# Patient Record
Sex: Male | Born: 1940 | Marital: Single | Smoking: Former smoker
Health system: Southern US, Community
[De-identification: ages and names within clinical notes are randomized; demographics above are authoritative.]

## PROBLEM LIST (undated history)

## (undated) DIAGNOSIS — F419 Anxiety disorder, unspecified: Secondary | ICD-10-CM

## (undated) DIAGNOSIS — J45909 Unspecified asthma, uncomplicated: Secondary | ICD-10-CM

## (undated) DIAGNOSIS — F32A Depression, unspecified: Secondary | ICD-10-CM

## (undated) DIAGNOSIS — I10 Essential (primary) hypertension: Secondary | ICD-10-CM

---

## 2011-11-25 ENCOUNTER — Inpatient Hospital Stay: Payer: Self-pay | Admitting: Internal Medicine

## 2011-11-25 LAB — COMPREHENSIVE METABOLIC PANEL
Albumin: 4.5 g/dL (ref 3.4–5.0)
Alkaline Phosphatase: 62 U/L (ref 50–136)
Anion Gap: 10 (ref 7–16)
BUN: 33 mg/dL — ABNORMAL HIGH (ref 7–18)
Bilirubin,Total: 0.8 mg/dL (ref 0.2–1.0)
Calcium, Total: 9.8 mg/dL (ref 8.5–10.1)
Chloride: 92 mmol/L — ABNORMAL LOW (ref 98–107)
Co2: 26 mmol/L (ref 21–32)
Creatinine: 1.56 mg/dL — ABNORMAL HIGH (ref 0.60–1.30)
EGFR (Non-African Amer.): 44 — ABNORMAL LOW
Glucose: 144 mg/dL — ABNORMAL HIGH (ref 65–99)
Osmolality: 267 (ref 275–301)
Potassium: 4.2 mmol/L (ref 3.5–5.1)
SGOT(AST): 24 U/L (ref 15–37)
Sodium: 128 mmol/L — ABNORMAL LOW (ref 136–145)
Total Protein: 8.6 g/dL — ABNORMAL HIGH (ref 6.4–8.2)

## 2011-11-25 LAB — CBC
HGB: 13.7 g/dL (ref 13.0–18.0)
MCH: 29.3 pg (ref 26.0–34.0)
MCHC: 33.6 g/dL (ref 32.0–36.0)
RDW: 13.9 % (ref 11.5–14.5)
WBC: 16.4 10*3/uL — ABNORMAL HIGH (ref 3.8–10.6)

## 2011-11-25 LAB — PRO B NATRIURETIC PEPTIDE: B-Type Natriuretic Peptide: 247 pg/mL — ABNORMAL HIGH (ref 0–125)

## 2011-11-25 LAB — TROPONIN I
Troponin-I: <0.02 ng/mL
Troponin-I: <0.02 ng/mL

## 2011-11-26 LAB — CBC WITH DIFFERENTIAL/PLATELET
Basophil #: 0 10*3/uL (ref 0.0–0.1)
Eosinophil #: 0 10*3/uL (ref 0.0–0.7)
Eosinophil %: 0.3 %
HCT: 33.6 % — ABNORMAL LOW (ref 40.0–52.0)
HGB: 11.5 g/dL — ABNORMAL LOW (ref 13.0–18.0)
Lymphocyte #: 0.7 10*3/uL — ABNORMAL LOW (ref 1.0–3.6)
Lymphocyte %: 6 %
MCH: 29.5 pg (ref 26.0–34.0)
MCHC: 34.2 g/dL (ref 32.0–36.0)
Monocyte #: 0.9 x10 3/mm (ref 0.2–1.0)
Monocyte %: 7.6 %
Neutrophil %: 86 %
RDW: 13.7 % (ref 11.5–14.5)
WBC: 12.3 10*3/uL — ABNORMAL HIGH (ref 3.8–10.6)

## 2011-11-26 LAB — BASIC METABOLIC PANEL
Calcium, Total: 8.5 mg/dL (ref 8.5–10.1)
Chloride: 99 mmol/L (ref 98–107)
Co2: 23 mmol/L (ref 21–32)
Creatinine: 1.59 mg/dL — ABNORMAL HIGH (ref 0.60–1.30)
EGFR (African American): 50 — ABNORMAL LOW
Glucose: 138 mg/dL — ABNORMAL HIGH (ref 65–99)
Sodium: 131 mmol/L — ABNORMAL LOW (ref 136–145)

## 2011-11-27 LAB — BASIC METABOLIC PANEL
Anion Gap: 9 (ref 7–16)
Calcium, Total: 8.3 mg/dL — ABNORMAL LOW (ref 8.5–10.1)
Chloride: 100 mmol/L (ref 98–107)
Co2: 24 mmol/L (ref 21–32)
Creatinine: 1.28 mg/dL (ref 0.60–1.30)
Glucose: 92 mg/dL (ref 65–99)
Potassium: 4 mmol/L (ref 3.5–5.1)
Sodium: 133 mmol/L — ABNORMAL LOW (ref 136–145)

## 2011-11-28 LAB — CBC WITH DIFFERENTIAL/PLATELET
Eosinophil #: 0.4 10*3/uL (ref 0.0–0.7)
Eosinophil %: 4.6 %
HCT: 31.6 % — ABNORMAL LOW (ref 40.0–52.0)
MCH: 29.3 pg (ref 26.0–34.0)
MCHC: 34.1 g/dL (ref 32.0–36.0)
MCV: 86 fL (ref 80–100)
Monocyte #: 0.9 x10 3/mm (ref 0.2–1.0)
Neutrophil #: 5.6 10*3/uL (ref 1.4–6.5)
Neutrophil %: 67.5 %
Platelet: 154 10*3/uL (ref 150–440)
RDW: 13.5 % (ref 11.5–14.5)
WBC: 8.2 10*3/uL (ref 3.8–10.6)

## 2011-11-29 DIAGNOSIS — R0602 Shortness of breath: Secondary | ICD-10-CM

## 2011-12-01 LAB — CULTURE, BLOOD (SINGLE)

## 2013-06-30 IMAGING — CR DG CHEST 2V
1 series · 3 of 3 positions shown · non-contrast
Comparison: none

REASON FOR EXAM: shob
COMMENTS:

[Series 1: w chest pa · 0.14mm/px · 3 of 3 slices shown]
[im 1/3]
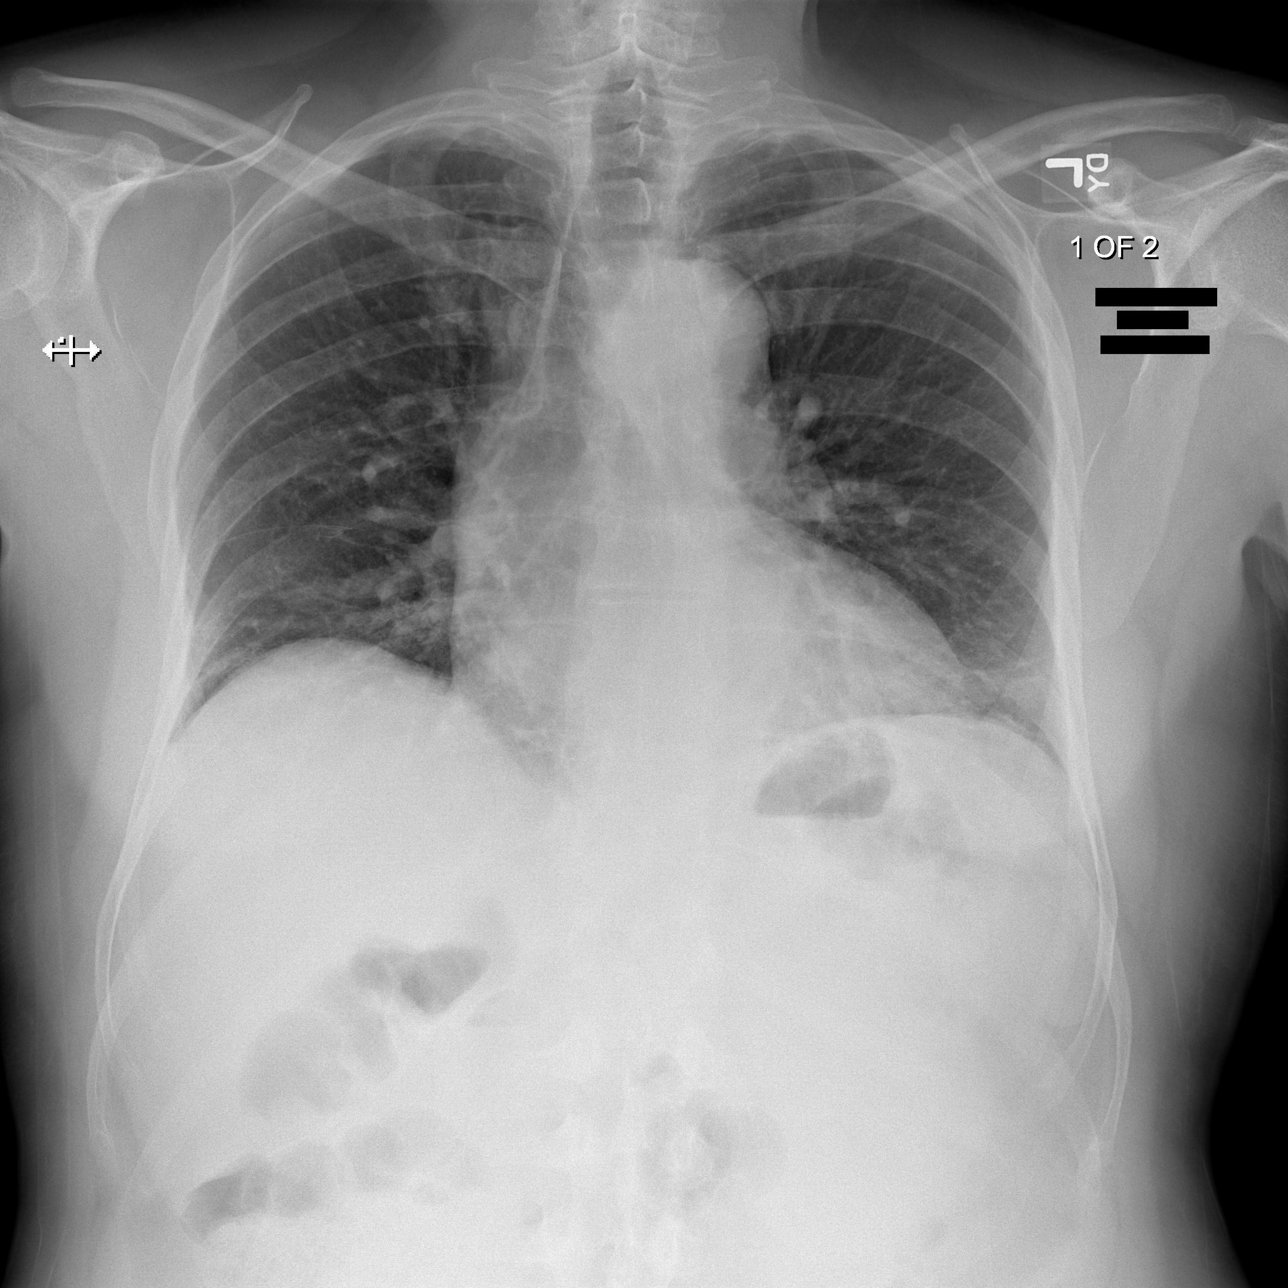
[im 2/3]
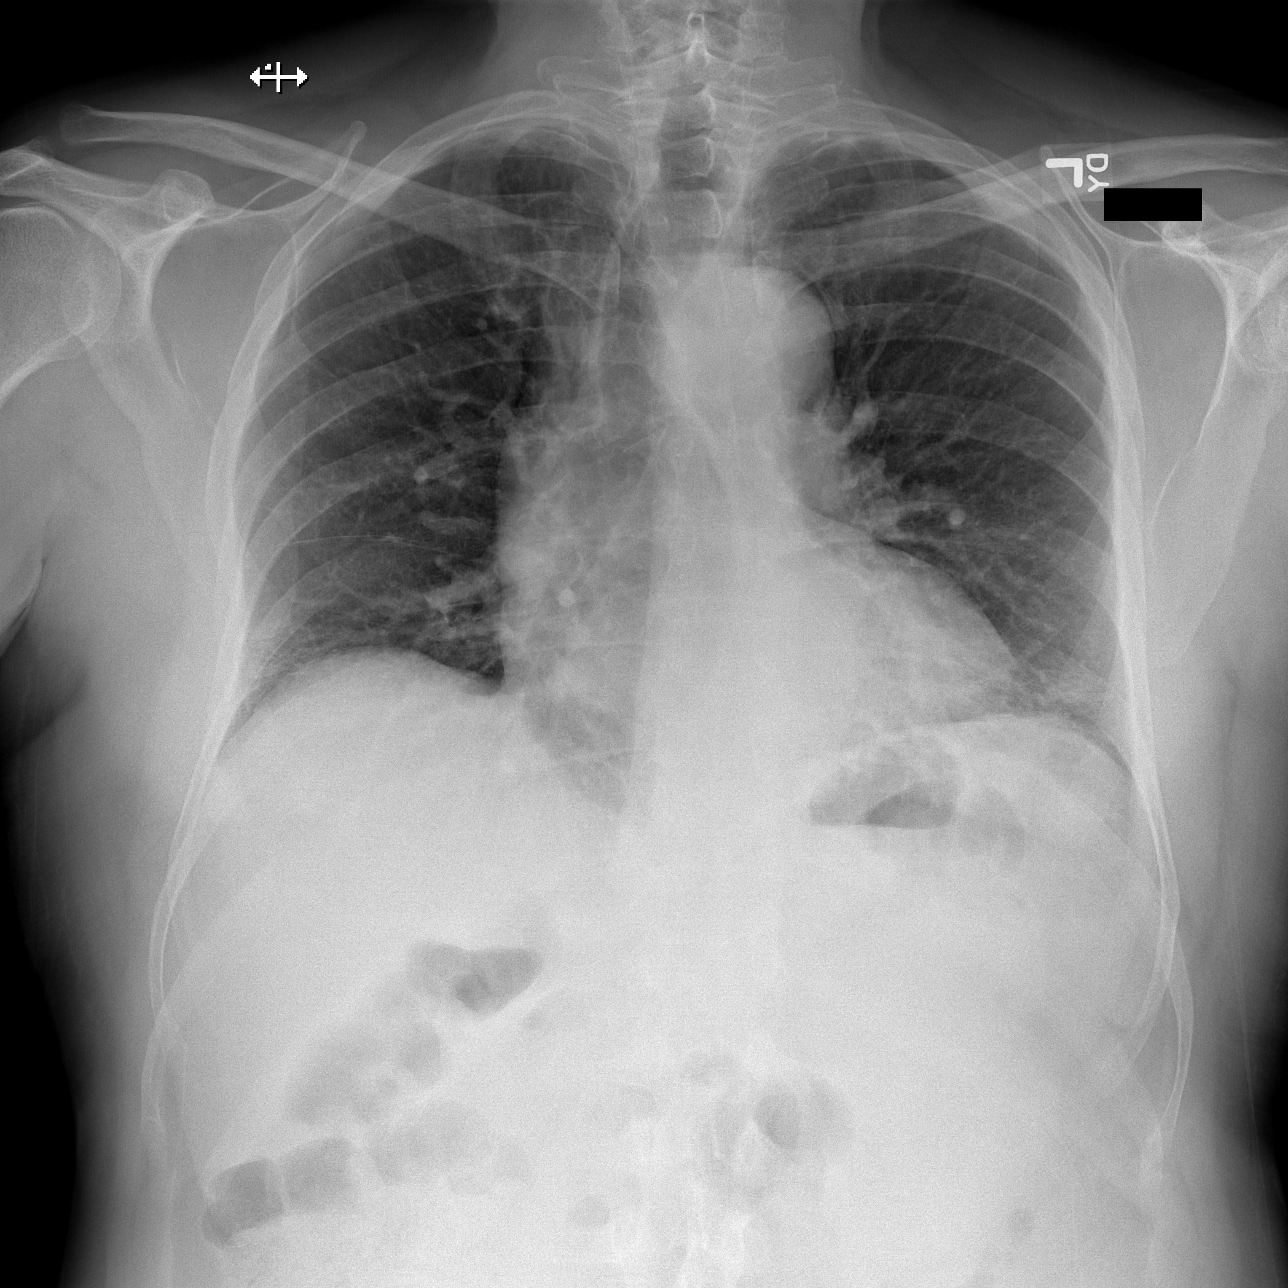
[im 3/3]
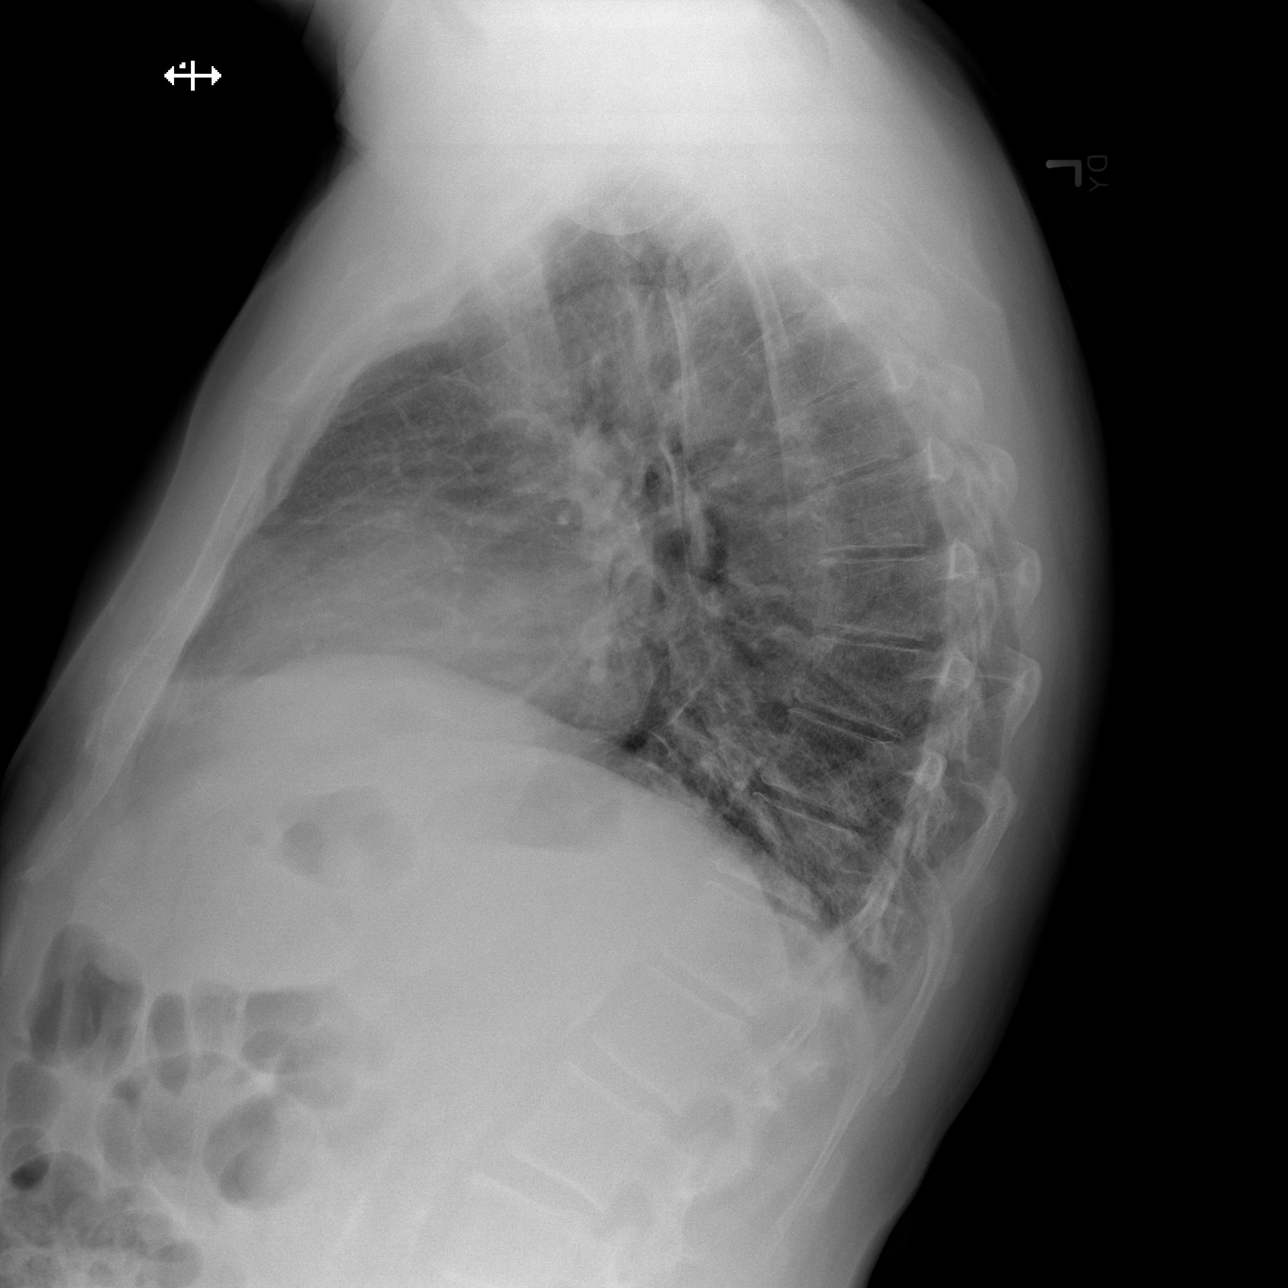

[3 of 3 positions shown; findings below may reference images not displayed]

PROCEDURE:     DXR - DXR CHEST PA (OR AP) AND LATERAL  - November 25, 2011 [DATE]

RESULT:     The lungs are hypoinflated. There are bibasilar atelectatic
changes. The cardiac silhouette is at the upper limits of normal for size.
The perihilar lung markings are prominent. On the lateral film coarse lung
markings project over the lower thoracic spine.
IMPRESSION: The findings suggest atelectasis or early infiltrate in the
lower lobes. There may be low-grade compensated CHF as well.

[REDACTED]

## 2013-07-02 IMAGING — CR DG CHEST 1V PORT
1 series · 1 of 1 positions shown · non-contrast
Comparison: none

REASON FOR EXAM: pneumonia
COMMENTS:

PROCEDURE:     DXR - DXR PORTABLE CHEST SINGLE VIEW  - November 27, 2011 [DATE]
RESULT:     Mild basilar atelectasis is present. The cardiovascular
structures are stable. Poor inspiratory effort.

[portable]
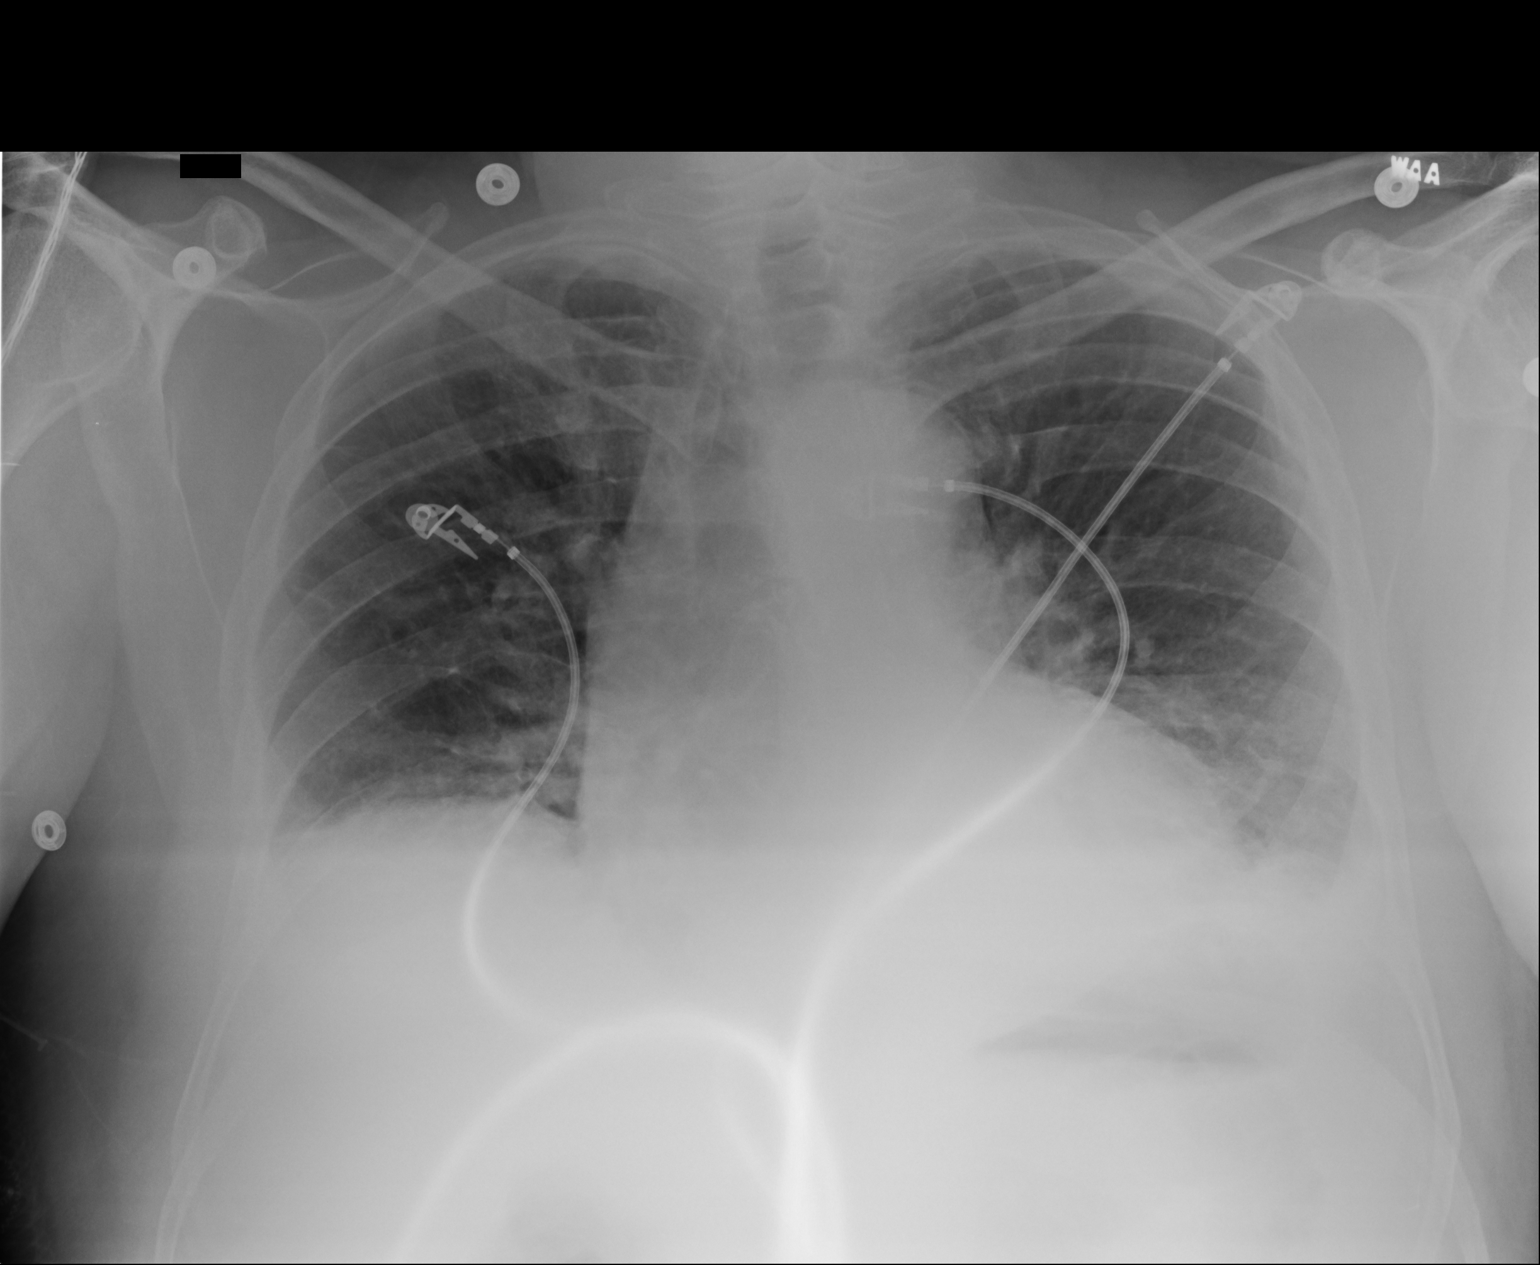

[1 of 1 positions shown; findings below may reference images not displayed]

IMPRESSION: Bibasilar atelectasis versus pneumonia. The findings are
new from 11/25/2011.

## 2014-11-14 NOTE — Discharge Summary (Signed)
PATIENT NAME:  Ledell PeoplesSHOFFNER, Troy L MR#:  [id number] DATE OF BIRTH:  1941/03/19  DATE OF ADMISSION:  11/25/2011 DATE OF DISCHARGE:  11/30/2011  DISCHARGE DIAGNOSES:  1. Hypoxic respiratory failure secondary to bilateral pneumonia.  2. Upper back pain due to musculoskeletal strain. 3. Resolved acute renal failure.  4. Hypertension. 5. History of panic attacks.   DISCHARGE MEDICATIONS:  1. Alprazolam 0.25 mg p.o. t.i.d. as needed. 2. Claritin 10 mg p.o. daily.  3. Fluoxetine 20 mg p.o. daily.  4. Aspirin 81 mg daily. 5. Benicar/HCTZ 20/12.5 mg p.o. half tablet daily.  6. Prednisone 40 mg daily for two days, 30 mg daily for two days, 20 mg daily for two days, 10 mg daily for two days.  7. Amoxicillin 500 mg p.o. t.i.d. for eight days. 8. Skelaxin 800 mg p.o. t.i.d. for back pain. 9. Norco 5/325 two tablets every six hours p.r.n.   DIET: Low sodium diet.   CONSULTATIONS: None.   FOLLOW-UP: The patient [place] a doctor in [place] and he can follow-up with that doctor.   ALLERGIES: Sulfa drugs.   HOSPITAL COURSE:  1. The patient is a 74 year old male with hypertension who came in because of back pain. The patient also had shortness of breath. The patient had an x-ray of the chest which showed atelectasis or early infiltrate in the lower lobes. He had elevated white count of 16,000 on admission. CT chest was done to rule out PE, PE negative but does have pneumonia bilaterally and COPD. The patient was placed on high flow nasal cannula and admitted to the hospitalist service on telemetry for pneumonia, started on Rocephin, Zithromax, and nebulizers. He was started on oxygen. The patient sats were 84% on 2 liters so he was started on high flow nasal cannula. Initially he was on Ventimask at 93% sats but he was unable to tolerate the mask so he was switched over to high flow nasal cannula and he was on it from May 6th to May 9th. Yesterday we titrated and weaned off high flow and changed to nasal  cannula. The patient has been afebrile and his oxygenation improved. This morning his sats were 98% on room air and also even with exertion they were 96%. The patient denies any chest pain, no trouble breathing, and no fevers. His white count also is improved to 8.2 on May 8th. On admission it showed 12.3 on May 6th. The patient's blood cultures have been negative. Troponins have been negative. CT of the chest showed bilateral pneumonia versus atelectasis.  2. Acute renal failure secondary to dehydration. His BUN and creatinine were elevated on admission and he is on IV fluids. His Benicar was held. Most recent BUN and creatinine on May 7th showed BUN 21 and creatinine 1.28. IV fluids were stopped. Ultrasound of the kidneys showed no acute changes. There was a small cyst in the left kidney and also small right kidney. The patient had normal urine output, did not have fever and no back pain. The patient had an echocardiogram done which showed EF more than 55% and normal systolic function with diastolic relaxation is affected. The same is explained to the patient.   CONDITION ON DISCHARGE: The patient is discharged home in stable condition.   TOTAL TIME SPENT ON DISCHARGE PREPARATION: More than 30 minutes.   ____________________________ Katha HammingSnehalatha Loy Mccartt, MD sk:drc D: 11/30/2011 23:17:42 ET T: 12/04/2011 09:10:55 ET JOB#: [id number]  cc: Katha HammingSnehalatha Ranada Vigorito, MD, <Dictator> Katha HammingSNEHALATHA Barnabas Henriques MD ELECTRONICALLY SIGNED 12/05/2011 23:24

## 2014-11-14 NOTE — H&P (Signed)
PATIENT NAME:  Troy Nash, Troy Nash MR#:  [id number] DATE OF BIRTH:  08-05-1940  DATE OF ADMISSION:  11/25/2011  PRIMARY CARE PHYSICIAN[place]-local[place]   CHIEF COMPLAINT: Back pain.   HISTORY OF PRESENT ILLNESS: This is a 74 year old male with history of hypertension, panic disorder, and allergies. Today he presented to the Emergency Room because he is complaining of upper back pain going on for a few days. He says he cannot take a deep breath. He feels a catch when he takes a deep breath. Initially he was using a heating pad on his back that was helping him. He said yesterday he did a lot of work and that exacerbated the back pain. He said has a mild cough also, is slightly short of breath but is not complaining of shortness of breath per se.  He said the pain is in between the shoulder blades.  He denies any chest pain. In the Emergency Room a chest x-ray showed that he had atelectasis versus early infiltrate in the lower lobes. His white count was elevated to 16,000 and a CT of the chest was done to rule out PE. He is negative for pulmonary embolism but there is bibasilar pneumonia and there is also seen some chronic obstructive pulmonary disease. He does not have a diagnosis of chronic obstructive pulmonary disease. His ABG here showed that his pO2 is 52 on room air.  He thinks that it is because of his increased work-out yesterday that he has the back pain. He was also nauseous yesterday and this morning the pain was so severe that he had to come to the Emergency Room. He got a dose of 2 mg of IV morphine that helped his pain. He already got a dose of ceftriaxone and Zithromax in the Emergency Room. Blood cultures have been drawn. He said he has never been hospitalized in the past. He said his anxiety level went up after they told him that he has bibasilar pneumonia and possible chronic obstructive pulmonary disease.  He denies any fever. He is being admitted for bibasilar  pneumonia, hypoxia, acute renal failure, and hyponatremia.   REVIEW OF SYSTEMS: No fever. No weakness. No acute change in vision. No headache. No dizziness. Complaining of mild cough. No expectoration. No hemoptysis. He is complaining of some painful respirations. He says he cannot take a deep breath. No dyspnea on exertion, palpitations, or syncope. Complaining of mild nausea. No abdominal pain. No GI bleed. No dysuria. No frequency.  No thyroid problems.  No anemia.  No rash. No joint pains. No focal numbness or weakness. He has anxiety and panic disorder.    PAST MEDICAL HISTORY:  1. Hypertension.  2. Panic disorder.  3. He had asthma as a child but he does not have any asthma problems now. 4. Allergies.  HOSPITALIZATIONS:  He has never been hospitalized in the past.  PAST SURGICAL HISTORY: He probably had a tonsillectomy as a child but no other surgical procedures since then.   ALLERGIES TO MEDICATIONS: Sulfa.   HOME MEDICATIONS:  1. Alprazolam 0.25 mg 3 times a day.  2. Benicar/hydrochlorothiazide. He said he takes half a pill a day, 20/12.5. 3. Claritin 10 mg daily.   SOCIAL HISTORY: He lives in [place], [place] but he visits his girlfriend who lives in [place] and he spends a lot of time here. He quit smoking when he was 32. He probably smoked from a teenager to 37. He was a IT sales professional, retired. No  alcohol or drug use.   FAMILY HISTORY:  Father had lung cancer. Mother was quite healthy, died in her 5890s.   PHYSICAL EXAMINATION:  VITAL SIGNS: Temperature 97.6, heart rate 88, respiratory rate 28, blood pressure 153/69, saturating 93% on room air. Currently, heart rate 77, blood pressure 143/73, saturating 93% on two liters. Respiratory rate ranging from 20 to 32.   GENERAL: This is an elderly Caucasian male, well built. He is comfortably lying in bed. He does not appear to be any distress.   HEENT: Bilateral pupils are equal. Extraocular muscles intact. No scleral  icterus. No conjunctivitis. Oral mucosa appears to be dry. No pallor.   NECK: No thyroid tenderness, enlargement, or nodule. Neck is supple. No masses, nontender. No adenopathy. No JVD. No carotid bruit.   CHEST: He has bilateral crackles at the bases but no wheezing. Normal respiratory effort. Not using accessory muscles of respiration.   HEART: Heart sounds are regular.  He has a murmur. Good peripheral pulses. No lower extremity edema.   ABDOMEN: Soft, nontender. Normal bowel sounds. No hepatosplenomegaly. No bruit. No masses.   RECTAL: Deferred.   NEUROLOGIC: He is awake, alert, oriented to time, place, and person. Slightly anxious right now because he is being admitted to the hospital.  Cranial nerves are intact. Moving all extremities against gravity.   EXTREMITIES: No cyanosis. No clubbing.   SKIN: No rash. No lesions.   LABORATORY, DIAGNOSTIC, AND RADIOLOGICAL DATA: White count of 16.4, hemoglobin 13.7. He had platelet clumping so they did not report it. BMP: Sodium 128, potassium 4.2, BUN 33, creatinine 1.56. BNP is 247, essentially negative. Troponin negative. D-dimer 0.52. Chest x-ray shows atelectasis or early infiltrate in the lower lobes and his CT of the chest was suggestive of no PE but bibasilar pneumonia versus atelectasis superimposed on chronic obstructive pulmonary disease, enlargement of the cardiac chambers but no pulmonary edema. His ABG shows pH 7.45, pCO2 35, pO2 52 on room air. His EKG shows that he has sinus rhythm, normal EKG, no acute ischemic changes.   IMPRESSION:  1. Bibasilar pneumonia with leukocytosis and hypoxia.  2. Acute renal failure.  3. Hyponatremia, maybe secondary to hydrochlorothiazide.  4. Hypertension.  5. Panic disorder.  PLAN: 74 year old male with history of hypertension and panic disorder. He is visiting [place]. [place] lives in [place]. He comes in with upper back pain going on for a few days. He was found to  have a white count of 16,000, hypoxic, pO2 of 52 on room air, and bibasilar pneumonia. He already got a dose of ceftriaxone and Zithromax. Blood cultures have been drawn. We will continue ceftriaxone and Zithromax. We will give him some IV hydration. Hold his Benicar/hydrochlorothiazide at this time. Hydrochlorothiazide should be stopped at discharge because he is quite hyponatremic.  He can be discharged on just plain Benicar or we can substitute a different antihypertensive. He does not appear to be in any congestive heart failure at this time. He is not wheezing. We will continue his Xanax and fluoxetine.  He wants his Xanax scheduled at 0.25 mg 3 times a day. I will order that. CT chest shows chronic obstructive pulmonary disease but he has not smoked since he was 74 years of age. He probably needs PFTs as outpatient when he is discharged. His EKG is normal. We will check serial cardiac enzymes also, but this is unlikely to be cardiac.  His back pain could be musculoskeletal or maybe coming from the bibasilar pneumonia.  I will place him on Norco p.r.n. and morphine sparingly for only severe pain. The plan was discussed with the patient and girlfriend in detail.    TIME SPENT ON ADMISSION AND COORDINATION OF CARE: 50 minutes.   ____________________________ Fredia Sorrow, MD ag:bjt D: 11/25/2011 18:13:39 ET T: 11/26/2011 09:08:08 ET JOB#: [id number]  cc: Fredia Sorrow, MD, <Dictator> Fredia Sorrow MD ELECTRONICALLY SIGNED 12/21/2011 17:42

## 2015-06-28 ENCOUNTER — Other Ambulatory Visit: Payer: Self-pay | Admitting: Internal Medicine

## 2015-06-28 DIAGNOSIS — I639 Cerebral infarction, unspecified: Secondary | ICD-10-CM

## 2015-07-14 ENCOUNTER — Ambulatory Visit: Payer: BC Managed Care – PPO

## 2021-07-10 ENCOUNTER — Ambulatory Visit: Payer: Self-pay | Admitting: General Surgery

## 2021-07-10 NOTE — H&P (Signed)
°PATIENT PROFILE: °Troy Nash is a 80 y.o. male who presents to the Clinic for consultation at the request of Dr. Miller for evaluation of right inguinal hernia. ° °PCP:  Miller, Mark Frederic, MD ° °HISTORY OF PRESENT ILLNESS: °Troy Nash reports feeling a right inguinal hernia since few weeks ago.  He endorses that he has been feeling a bulge in the right groin.  He has been able to reduce the volume.  Initially he was small but he has been growing in size.  Denies any pain radiation.  Aggravating factor is applying pressure to the area.  Alleviating factor is reducing the hernia.  Patient denies any episode of abdominal distention nausea or vomiting.  Patient denies any pain in the left groin. 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/11/2017  ° Medicare annual wellness visit, initial 06/28/2016  ° Overview  °  12/17, 12/18, 1/20, 1/22 °  °  ° Hypertension Unknown  ° ° °GENERAL REVIEW OF SYSTEMS:  ° °General ROS: negative for - chills, fatigue, fever, weight gain or weight loss °Allergy and Immunology ROS: negative for - hives  °Hematological and Lymphatic ROS: negative for - bleeding problems or bruising, negative for palpable nodes °Endocrine ROS: negative for - heat or cold intolerance, hair changes °Respiratory ROS: negative for - cough, shortness of breath or wheezing °Cardiovascular ROS: no chest pain or palpitations °GI ROS: negative for nausea, vomiting, abdominal pain, diarrhea, constipation °Musculoskeletal ROS: negative for - joint swelling or muscle pain °Neurological ROS: negative for - confusion, syncope °Dermatological ROS: negative for pruritus and rash °Psychiatric: negative for anxiety, depression, difficulty sleeping and memory loss ° °MEDICATIONS: °Current Outpatient Medications  °Medication Sig Dispense Refill  ° ALPRAZolam (XANAX) 0.25 MG tablet Take 1 tablet (0.25 mg  total) by mouth 3 (three) times daily as needed for Sleep 90 tablet 5  ° aspirin 81 MG chewable tablet Take 1 tablet (81 mg total) by mouth once daily    ° FLUoxetine (PROZAC) 20 MG capsule Take 2 capsules (40 mg total) by mouth once daily 180 capsule 3  ° loratadine (CLARITIN) 10 mg tablet Take 1 tablet (10 mg total) by mouth once daily    ° olmesartan (BENICAR) 20 MG tablet Take 1 tablet (20 mg total) by mouth once daily 90 tablet 3  ° tadalafiL (CIALIS) 20 MG tablet Take 1 tablet (20 mg total) by mouth once daily as needed 40 tablet 11  ° tiZANidine (ZANAFLEX) 4 MG tablet TAKE 1 TABLET(4 MG) BY MOUTH EVERY NIGHT 30 tablet 11  ° °No current facility-administered medications for this visit.  ° ° °ALLERGIES: °Sulfa (sulfonamide antibiotics) ° °PAST MEDICAL HISTORY: °Past Medical History:  °Diagnosis Date  ° Hypertension   ° ° °PAST SURGICAL HISTORY: °History reviewed. No pertinent surgical history.  ° °FAMILY HISTORY: °Family History  °Problem Relation Age of Onset  ° No Known Problems Mother   ° No Known Problems Father   °  ° °SOCIAL HISTORY: °Social History  ° °Socioeconomic History  ° Marital status: Married  °Tobacco Use  ° Smoking status: Former  ° Smokeless tobacco: Never  °Vaping Use  ° Vaping Use: Never used  °Substance and Sexual Activity  ° Alcohol use: Not Currently  °  Alcohol/week: 0.0 standard drinks  ° Drug use: Defer  ° Sexual activity: Defer  ° ° °PHYSICAL   EXAM: °Vitals:  ° 07/10/21 1534  °BP: (!) 165/87  °Pulse: 62  ° °Body mass index is 25.97 kg/m². °Weight: 82.1 kg (181 lb)  ° °GENERAL: Alert, active, oriented x3 ° °HEENT: Pupils equal reactive to light. Extraocular movements are intact. Sclera clear. Palpebral conjunctiva normal red color.Pharynx clear. ° °NECK: Supple with no palpable mass and no adenopathy. ° °LUNGS: Sound clear with no rales rhonchi or wheezes. ° °HEART: Regular rhythm S1 and S2 without murmur. ° °ABDOMEN: Soft and depressible, nontender with no palpable mass, no  hepatomegaly.  Palpable, reducible right inguinal hernia.  There is some weakness on the left groin but no palpable hernia at the moment. ° °EXTREMITIES: Well-developed well-nourished symmetrical with no dependent edema. ° °NEUROLOGICAL: Awake alert oriented, facial expression symmetrical, moving all extremities. ° °REVIEW OF DATA: °I have reviewed the following data today: °No visits with results within 3 Month(s) from this visit.  °Latest known visit with results is:  °Appointment on 08/02/2020  °Component Date Value  ° WBC (White Blood Cell Co* 08/02/2020 6.3   ° RBC (Red Blood Cell Coun* 08/02/2020 4.85   ° Hemoglobin 08/02/2020 14.4   ° Hematocrit 08/02/2020 43.6   ° MCV (Mean Corpuscular Vo* 08/02/2020 89.9   ° MCH (Mean Corpuscular He* 08/02/2020 29.7   ° MCHC (Mean Corpuscular H* 08/02/2020 33.0   ° Platelet Count 08/02/2020 180   ° RDW-CV (Red Cell Distrib* 08/02/2020 13.3   ° MPV (Mean Platelet Volum* 08/02/2020 10.9   ° Neutrophils 08/02/2020 3.60   ° Lymphocytes 08/02/2020 1.78   ° Monocytes 08/02/2020 0.70   ° Eosinophils 08/02/2020 0.18   ° Basophils 08/02/2020 0.02   ° Neutrophil % 08/02/2020 57.2   ° Lymphocyte % 08/02/2020 28.3   ° Monocyte % 08/02/2020 11.1   ° Eosinophil % 08/02/2020 2.9   ° Basophil% 08/02/2020 0.3   ° Immature Granulocyte % 08/02/2020 0.2   ° Immature Granulocyte Cou* 08/02/2020 0.01   ° Glucose 08/02/2020 89   ° Sodium 08/02/2020 134 (L)   ° Potassium 08/02/2020 4.4   ° Chloride 08/02/2020 98   ° Carbon Dioxide (CO2) 08/02/2020 30.5   ° Urea Nitrogen (BUN) 08/02/2020 24   ° Creatinine 08/02/2020 1.2   ° Glomerular Filtration Ra* 08/02/2020 58 (L)   ° Calcium 08/02/2020 9.7   ° AST  08/02/2020 19   ° ALT  08/02/2020 10   ° Alk Phos (alkaline Phosp* 08/02/2020 61   ° Albumin 08/02/2020 4.6   ° Bilirubin, Total 08/02/2020 0.7   ° Protein, Total 08/02/2020 7.0   ° A/G Ratio 08/02/2020 1.9   ° Cholesterol, Total 08/02/2020 171   ° Triglyceride 08/02/2020 84   ° HDL (High Density  Lipopr* 08/02/2020 73.4 (H)   ° LDL Calculated 08/02/2020 81   ° VLDL Cholesterol 08/02/2020 17   ° Cholesterol/HDL Ratio 08/02/2020 2.3   ° PSA (Prostate Specific A* 08/02/2020 0.76   ° Thyroid Stimulating Horm* 08/02/2020 2.990   ° Color 08/02/2020 Yellow   ° Clarity 08/02/2020 Clear   ° Specific Gravity 08/02/2020 1.025   ° pH, Urine 08/02/2020 5.5   ° Protein, Urinalysis 08/02/2020 Negative   ° Glucose, Urinalysis 08/02/2020 Negative   ° Ketones, Urinalysis 08/02/2020 Negative   ° Blood, Urinalysis 08/02/2020 Negative   ° Nitrite, Urinalysis 08/02/2020 Negative   ° Leukocyte Esterase, Urin* 08/02/2020 Negative   ° White Blood Cells, Urina* 08/02/2020 None Seen   ° Red Blood Cells, Urinaly* 08/02/2020 None Seen   °   Bacteria, Urinalysis 08/02/2020 None Seen    Squamous Epithelial Cell* 08/02/2020 None Seen      ASSESSMENT: Troy Nash is a 80 y.o. male presenting for consultation for right versus bilateral inguinal hernia.    The patient presents with a symptomatic, reducible right versus bilateral inguinal hernia. Patient was oriented about the diagnosis of inguinal hernia and its implication. The patient was oriented about the treatment alternatives (observation vs surgical repair). Due to patient symptoms, repair is recommended. Patient oriented about the surgical procedure, the use of mesh and its risk of complications such as: infection, bleeding, injury to vas deference, vasculature and testicle, injury to bowel or bladder, and chronic pain.   Non-recurrent unilateral inguinal hernia without obstruction or gangrene [K40.90]  PLAN: 1.  Robotic assisted laparoscopic right vs bilateral inguinal hernia repair with mesh (90903) 2.  CBC, CMP 3.  Avoid taking aspirin 5 days before procedure 4.  Contact us if has any question or concern.   Patient verbalized understanding, all questions were answered, and were agreeable with the plan outlined above.    Herbert Pun,  MD  Electronically signed by Herbert Pun, MD

## 2021-07-10 NOTE — H&P (View-Only) (Signed)
PATIENT PROFILE: Troy Nash is a 80 y.o. male who presents to the Clinic for consultation at the request of Dr. Sabra Heck for evaluation of right inguinal hernia.  PCP:  Yevonne Pax, MD  HISTORY OF PRESENT ILLNESS: Troy Nash reports feeling a right inguinal hernia since few weeks ago.  He endorses that he has been feeling a bulge in the right groin.  He has been able to reduce the volume.  Initially he was small but he has been growing in size.  Denies any pain radiation.  Aggravating factor is applying pressure to the area.  Alleviating factor is reducing the hernia.  Patient denies any episode of abdominal distention nausea or vomiting.  Patient denies any pain in the left groin.   PROBLEM LIST: Problem List  Date Reviewed: 07/30/2019          Noted   Recurrent major depressive disorder, in partial remission (CMS-HCC) 07/11/2017   Medicare annual wellness visit, initial 06/28/2016   Overview    12/17, 12/18, 1/20, 1/22      Hypertension Unknown    GENERAL REVIEW OF SYSTEMS:   General ROS: negative for - chills, fatigue, fever, weight gain or weight loss Allergy and Immunology ROS: negative for - hives  Hematological and Lymphatic ROS: negative for - bleeding problems or bruising, negative for palpable nodes Endocrine ROS: negative for - heat or cold intolerance, hair changes Respiratory ROS: negative for - cough, shortness of breath or wheezing Cardiovascular ROS: no chest pain or palpitations GI ROS: negative for nausea, vomiting, abdominal pain, diarrhea, constipation Musculoskeletal ROS: negative for - joint swelling or muscle pain Neurological ROS: negative for - confusion, syncope Dermatological ROS: negative for pruritus and rash Psychiatric: negative for anxiety, depression, difficulty sleeping and memory loss  MEDICATIONS: Current Outpatient Medications  Medication Sig Dispense Refill   ALPRAZolam (XANAX) 0.25 MG tablet Take 1 tablet (0.25 mg  total) by mouth 3 (three) times daily as needed for Sleep 90 tablet 5   aspirin 81 MG chewable tablet Take 1 tablet (81 mg total) by mouth once daily     FLUoxetine (PROZAC) 20 MG capsule Take 2 capsules (40 mg total) by mouth once daily 180 capsule 3   loratadine (CLARITIN) 10 mg tablet Take 1 tablet (10 mg total) by mouth once daily     olmesartan (BENICAR) 20 MG tablet Take 1 tablet (20 mg total) by mouth once daily 90 tablet 3   tadalafiL (CIALIS) 20 MG tablet Take 1 tablet (20 mg total) by mouth once daily as needed 40 tablet 11   tiZANidine (ZANAFLEX) 4 MG tablet TAKE 1 TABLET(4 MG) BY MOUTH EVERY NIGHT 30 tablet 11   No current facility-administered medications for this visit.    ALLERGIES: Sulfa (sulfonamide antibiotics)  PAST MEDICAL HISTORY: Past Medical History:  Diagnosis Date   Hypertension     PAST SURGICAL HISTORY: History reviewed. No pertinent surgical history.   FAMILY HISTORY: Family History  Problem Relation Age of Onset   No Known Problems Mother    No Known Problems Father      SOCIAL HISTORY: Social History   Socioeconomic History   Marital status: Married  Tobacco Use   Smoking status: Former   Smokeless tobacco: Never  Scientific laboratory technician Use: Never used  Substance and Sexual Activity   Alcohol use: Not Currently    Alcohol/week: 0.0 standard drinks   Drug use: Defer   Sexual activity: Defer    PHYSICAL  EXAM: Vitals:   07/10/21 1534  BP: (!) 165/87  Pulse: 62   Body mass index is 25.97 kg/m. Weight: 82.1 kg (181 lb)   GENERAL: Alert, active, oriented x3  HEENT: Pupils equal reactive to light. Extraocular movements are intact. Sclera clear. Palpebral conjunctiva normal red color.Pharynx clear.  NECK: Supple with no palpable mass and no adenopathy.  LUNGS: Sound clear with no rales rhonchi or wheezes.  HEART: Regular rhythm S1 and S2 without murmur.  ABDOMEN: Soft and depressible, nontender with no palpable mass, no  hepatomegaly.  Palpable, reducible right inguinal hernia.  There is some weakness on the left groin but no palpable hernia at the moment.  EXTREMITIES: Well-developed well-nourished symmetrical with no dependent edema.  NEUROLOGICAL: Awake alert oriented, facial expression symmetrical, moving all extremities.  REVIEW OF DATA: I have reviewed the following data today: No visits with results within 3 Month(s) from this visit.  Latest known visit with results is:  Appointment on 08/02/2020  Component Date Value   WBC (White Blood Cell Co* 08/02/2020 6.3    RBC (Red Blood Cell Coun* 08/02/2020 4.85    Hemoglobin 08/02/2020 14.4    Hematocrit 08/02/2020 43.6    MCV (Mean Corpuscular Vo* 08/02/2020 89.9    MCH (Mean Corpuscular He* 08/02/2020 29.7    MCHC (Mean Corpuscular H* 08/02/2020 33.0    Platelet Count 08/02/2020 180    RDW-CV (Red Cell Distrib* 08/02/2020 13.3    MPV (Mean Platelet Volum* 08/02/2020 10.9    Neutrophils 08/02/2020 3.60    Lymphocytes 08/02/2020 1.78    Monocytes 08/02/2020 0.70    Eosinophils 08/02/2020 0.18    Basophils 08/02/2020 0.02    Neutrophil % 08/02/2020 57.2    Lymphocyte % 08/02/2020 28.3    Monocyte % 08/02/2020 11.1    Eosinophil % 08/02/2020 2.9    Basophil% 08/02/2020 0.3    Immature Granulocyte % 08/02/2020 0.2    Immature Granulocyte Cou* 08/02/2020 0.01    Glucose 08/02/2020 89    Sodium 08/02/2020 134 (L)    Potassium 08/02/2020 4.4    Chloride 08/02/2020 98    Carbon Dioxide (CO2) 08/02/2020 30.5    Urea Nitrogen (BUN) 08/02/2020 24    Creatinine 08/02/2020 1.2    Glomerular Filtration Ra* 08/02/2020 58 (L)    Calcium 08/02/2020 9.7    AST  08/02/2020 19    ALT  08/02/2020 10    Alk Phos (alkaline Phosp* 08/02/2020 61    Albumin 08/02/2020 4.6    Bilirubin, Total 08/02/2020 0.7    Protein, Total 08/02/2020 7.0    A/G Ratio 08/02/2020 1.9    Cholesterol, Total 08/02/2020 171    Triglyceride 08/02/2020 84    HDL (High Density  Lipopr* 08/02/2020 73.4 (H)    LDL Calculated 08/02/2020 81    VLDL Cholesterol 08/02/2020 17    Cholesterol/HDL Ratio 08/02/2020 2.3    PSA (Prostate Specific A* 08/02/2020 0.76    Thyroid Stimulating Horm* 08/02/2020 2.990    Color 08/02/2020 Yellow    Clarity 08/02/2020 Clear    Specific Gravity 08/02/2020 1.025    pH, Urine 08/02/2020 5.5    Protein, Urinalysis 08/02/2020 Negative    Glucose, Urinalysis 08/02/2020 Negative    Ketones, Urinalysis 08/02/2020 Negative    Blood, Urinalysis 08/02/2020 Negative    Nitrite, Urinalysis 08/02/2020 Negative    Leukocyte Esterase, Urin* 08/02/2020 Negative    White Blood Cells, Urina* 08/02/2020 None Seen    Red Blood Cells, Urinaly* 08/02/2020 None Seen  Bacteria, Urinalysis 08/02/2020 None Seen    Squamous Epithelial Cell* 08/02/2020 None Seen      ASSESSMENT: Troy Nash is a 80 y.o. male presenting for consultation for right versus bilateral inguinal hernia.    The patient presents with a symptomatic, reducible right versus bilateral inguinal hernia. Patient was oriented about the diagnosis of inguinal hernia and its implication. The patient was oriented about the treatment alternatives (observation vs surgical repair). Due to patient symptoms, repair is recommended. Patient oriented about the surgical procedure, the use of mesh and its risk of complications such as: infection, bleeding, injury to vas deference, vasculature and testicle, injury to bowel or bladder, and chronic pain.   Non-recurrent unilateral inguinal hernia without obstruction or gangrene [K40.90]  PLAN: 1.  Robotic assisted laparoscopic right vs bilateral inguinal hernia repair with mesh (90903) 2.  CBC, CMP 3.  Avoid taking aspirin 5 days before procedure 4.  Contact us if has any question or concern.   Patient verbalized understanding, all questions were answered, and were agreeable with the plan outlined above.    Herbert Pun,  MD  Electronically signed by Herbert Pun, MD

## 2021-07-20 ENCOUNTER — Other Ambulatory Visit: Payer: Self-pay

## 2021-07-20 ENCOUNTER — Encounter
Admission: RE | Admit: 2021-07-20 | Discharge: 2021-07-20 | Disposition: A | Discharge status: Home / Self Care | Payer: Medicare Other | Source: Ambulatory Visit | Attending: General Surgery | Admitting: General Surgery

## 2021-07-20 HISTORY — DX: Depression, unspecified: F32.A

## 2021-07-20 HISTORY — DX: Unspecified asthma, uncomplicated: J45.909

## 2021-07-20 HISTORY — DX: Anxiety disorder, unspecified: F41.9

## 2021-07-20 HISTORY — DX: Essential (primary) hypertension: I10

## 2021-07-20 NOTE — Patient Instructions (Addendum)
Your procedure is scheduled on: Monday, January 9 Report to the Registration Desk on the 1st floor of the [facility] To find out your arrival time, please call [phone] between 1PM - 3PM on: Friday, January 6  REMEMBER: Instructions that are not followed completely may result in serious medical risk, up to and including death; or upon the discretion of your surgeon and anesthesiologist your surgery may need to be rescheduled.  Do not eat food after midnight the night before surgery.  No gum chewing, lozengers or hard candies.  You may however, drink CLEAR liquids up to 2 hours before you are scheduled to arrive for your surgery. Do not drink anything within 2 hours of your scheduled arrival time.  Clear liquids include: - water  - apple juice without pulp - gatorade (not RED, PURPLE, OR BLUE) - black coffee or tea (Do NOT add milk or creamers to the coffee or tea) Do NOT drink anything that is not on this list.  TAKE THESE MEDICATIONS THE MORNING OF SURGERY WITH A SIP OF WATER:  Fluoxetine (Prozac) Alprazolam (xanax) if needed for pain  One week prior to surgery: starting January 2 Stop Anti-inflammatories (NSAIDS) such as Advil, Aleve, Ibuprofen, Motrin, Naproxen, Naprosyn and Aspirin based products such as Excedrin, Goodys Powder, BC Powder. Stop ALL OVER THE COUNTER supplements until after surgery. You may however, continue to take Tylenol if needed for pain up until the day of surgery.  No Alcohol for 24 hours before or after surgery.  No Smoking including e-cigarettes for 24 hours prior to surgery.  No chewable tobacco products for at least 6 hours prior to surgery.  No nicotine patches on the day of surgery.  On the morning of surgery brush your teeth with toothpaste and water, you may rinse your mouth with mouthwash if you wish. Do not swallow any toothpaste or mouthwash.  Use CHG Soap as directed on instruction sheet.  Do not wear jewelry.  Do not wear  lotions, powders, or perfumes.   Do not shave body from the neck down 48 hours prior to surgery just in case you cut yourself which could leave a site for infection.  Also, freshly shaved skin may become irritated if using the CHG soap.  Contact lenses, hearing aids and dentures may not be worn into surgery.  Do not bring valuables to the hospital. [facility] is not responsible for any missing/lost belongings or valuables.   Notify your doctor if there is any change in your medical condition (cold, fever, infection).  Wear comfortable clothing (specific to your surgery type) to the hospital.  After surgery, you can help prevent lung complications by doing breathing exercises.  Take deep breaths and cough every 1-2 hours. Your doctor may order a device called an Incentive Spirometer to help you take deep breaths. When coughing or sneezing, hold a pillow firmly against your incision with both hands. This is called splinting. Doing this helps protect your incision. It also decreases belly discomfort.  If you are being discharged the day of surgery, you will not be allowed to drive home. You will need a responsible adult (18 years or older) to drive you home and stay with you that night.   If you are taking public transportation, you will need to have a responsible adult (18 years or older) with you. Please confirm with your physician that it is acceptable to use public transportation.   Please call the Pre-admissions Testing Dept. at [phone] if you  have any questions about these instructions.  Surgery Visitation Policy:  Patients undergoing a surgery or procedure may have one family member or support person with them as long as that person is not COVID-19 positive or experiencing its symptoms.  That person may remain in the waiting area during the procedure and may rotate out with other people.

## 2021-07-21 ENCOUNTER — Encounter
Admission: RE | Admit: 2021-07-21 | Discharge: 2021-07-21 | Disposition: A | Discharge status: Home / Self Care | Payer: Medicare PPO | Source: Ambulatory Visit | Attending: General Surgery | Admitting: General Surgery

## 2021-07-21 DIAGNOSIS — Z01810 Encounter for preprocedural cardiovascular examination: Secondary | ICD-10-CM | POA: Insufficient documentation

## 2021-07-21 DIAGNOSIS — I10 Essential (primary) hypertension: Secondary | ICD-10-CM | POA: Insufficient documentation

## 2021-07-31 ENCOUNTER — Ambulatory Visit: Payer: Medicare PPO | Admitting: Certified Registered"

## 2021-07-31 ENCOUNTER — Other Ambulatory Visit: Payer: Self-pay

## 2021-07-31 ENCOUNTER — Encounter
Admission: RE | Disposition: A | Discharge status: Home / Self Care | Payer: Self-pay | Source: Home / Self Care | Attending: General Surgery

## 2021-07-31 ENCOUNTER — Ambulatory Visit
Admission: RE | Admit: 2021-07-31 | Discharge: 2021-07-31 | Disposition: A | Discharge status: Home / Self Care | Payer: Medicare PPO | Attending: General Surgery | Admitting: General Surgery

## 2021-07-31 ENCOUNTER — Encounter: Payer: Self-pay | Admitting: General Surgery

## 2021-07-31 DIAGNOSIS — Z87891 Personal history of nicotine dependence: Secondary | ICD-10-CM | POA: Diagnosis not present

## 2021-07-31 DIAGNOSIS — K4090 Unilateral inguinal hernia, without obstruction or gangrene, not specified as recurrent: Secondary | ICD-10-CM | POA: Diagnosis present

## 2021-07-31 DIAGNOSIS — F418 Other specified anxiety disorders: Secondary | ICD-10-CM | POA: Diagnosis not present

## 2021-07-31 DIAGNOSIS — J45909 Unspecified asthma, uncomplicated: Secondary | ICD-10-CM | POA: Diagnosis not present

## 2021-07-31 DIAGNOSIS — I10 Essential (primary) hypertension: Secondary | ICD-10-CM | POA: Diagnosis not present

## 2021-07-31 HISTORY — PX: XI ROBOTIC ASSISTED INGUINAL HERNIA REPAIR WITH MESH: SHX6706

## 2021-07-31 SURGERY — REPAIR, HERNIA, INGUINAL, ROBOT-ASSISTED, LAPAROSCOPIC, USING MESH
Anesthesia: General | Site: Groin | Laterality: Right

## 2021-07-31 MED ORDER — ONDANSETRON HCL 4 MG/2ML IJ SOLN: 4.0000 mg | Freq: Once | INTRAMUSCULAR | Status: DC | PRN

## 2021-07-31 MED ORDER — FENTANYL CITRATE (PF) 100 MCG/2ML IJ SOLN: 25.0000 ug | INTRAMUSCULAR | Status: DC | PRN

## 2021-07-31 MED ORDER — ONDANSETRON HCL 4 MG/2ML IJ SOLN
INTRAMUSCULAR | Status: DC | PRN
Start: 2021-07-31 — End: 2021-07-31
  Administered 2021-07-31: 4 mg via INTRAVENOUS

## 2021-07-31 MED ORDER — BUPIVACAINE-EPINEPHRINE 0.25% -1:200000 IJ SOLN
INTRAMUSCULAR | Status: DC | PRN
  Administered 2021-07-31: 8 mL
  Administered 2021-07-31: 22 mL

## 2021-07-31 MED ORDER — FENTANYL CITRATE (PF) 100 MCG/2ML IJ SOLN
INTRAMUSCULAR | Status: DC | PRN
  Administered 2021-07-31 (×2): 50 ug via INTRAVENOUS

## 2021-07-31 MED ORDER — ACETAMINOPHEN 10 MG/ML IV SOLN
INTRAVENOUS | Status: DC | PRN
  Administered 2021-07-31: 1000 mg via INTRAVENOUS

## 2021-07-31 MED ORDER — FAMOTIDINE 20 MG PO TABS
ORAL_TABLET | ORAL | Status: AC
  Administered 2021-07-31: 20 mg via ORAL
  Filled 2021-07-31: qty 1

## 2021-07-31 MED ORDER — ONDANSETRON HCL 4 MG/2ML IJ SOLN
INTRAMUSCULAR | Status: AC
  Filled 2021-07-31: qty 2

## 2021-07-31 MED ORDER — LACTATED RINGERS IV SOLN: INTRAVENOUS | Status: DC

## 2021-07-31 MED ORDER — LIDOCAINE HCL (CARDIAC) PF 100 MG/5ML IV SOSY
PREFILLED_SYRINGE | INTRAVENOUS | Status: DC | PRN
  Administered 2021-07-31: 100 mg via INTRAVENOUS

## 2021-07-31 MED ORDER — CEFAZOLIN SODIUM-DEXTROSE 2-4 GM/100ML-% IV SOLN
INTRAVENOUS | Status: AC
  Filled 2021-07-31: qty 100

## 2021-07-31 MED ORDER — EPHEDRINE 5 MG/ML INJ
INTRAVENOUS | Status: AC
  Filled 2021-07-31: qty 5

## 2021-07-31 MED ORDER — FAMOTIDINE 20 MG PO TABS: 20.0000 mg | ORAL_TABLET | Freq: Once | ORAL | Status: AC

## 2021-07-31 MED ORDER — DEXAMETHASONE SODIUM PHOSPHATE 10 MG/ML IJ SOLN
INTRAMUSCULAR | Status: DC | PRN
  Administered 2021-07-31: 10 mg via INTRAVENOUS

## 2021-07-31 MED ORDER — SUGAMMADEX SODIUM 200 MG/2ML IV SOLN
INTRAVENOUS | Status: DC | PRN
  Administered 2021-07-31: 200 mg via INTRAVENOUS

## 2021-07-31 MED ORDER — 0.9 % SODIUM CHLORIDE (POUR BTL) OPTIME
TOPICAL | Status: DC | PRN
  Administered 2021-07-31: 15 mL

## 2021-07-31 MED ORDER — BUPIVACAINE-EPINEPHRINE (PF) 0.25% -1:200000 IJ SOLN
INTRAMUSCULAR | Status: AC
  Filled 2021-07-31: qty 30

## 2021-07-31 MED ORDER — CEFAZOLIN SODIUM-DEXTROSE 2-4 GM/100ML-% IV SOLN
2.0000 g | INTRAVENOUS | Status: AC
  Administered 2021-07-31: 2 g via INTRAVENOUS

## 2021-07-31 MED ORDER — ROCURONIUM BROMIDE 10 MG/ML (PF) SYRINGE
PREFILLED_SYRINGE | INTRAVENOUS | Status: AC
  Filled 2021-07-31: qty 10

## 2021-07-31 MED ORDER — LIDOCAINE HCL (PF) 2 % IJ SOLN
INTRAMUSCULAR | Status: AC
  Filled 2021-07-31: qty 5

## 2021-07-31 MED ORDER — ORAL CARE MOUTH RINSE: 15.0000 mL | Freq: Once | OROMUCOSAL | Status: AC

## 2021-07-31 MED ORDER — ACETAMINOPHEN 10 MG/ML IV SOLN
INTRAVENOUS | Status: AC
  Filled 2021-07-31: qty 100

## 2021-07-31 MED ORDER — EPHEDRINE SULFATE 50 MG/ML IJ SOLN
INTRAMUSCULAR | Status: DC | PRN
  Administered 2021-07-31 (×2): 10 mg via INTRAVENOUS

## 2021-07-31 MED ORDER — HYDROCODONE-ACETAMINOPHEN 5-325 MG PO TABS: 1.0000 | ORAL_TABLET | ORAL | 0 refills | Status: AC | PRN

## 2021-07-31 MED ORDER — CHLORHEXIDINE GLUCONATE 0.12 % MT SOLN
OROMUCOSAL | Status: AC
  Administered 2021-07-31: 15 mL via OROMUCOSAL
  Filled 2021-07-31: qty 15

## 2021-07-31 MED ORDER — PROPOFOL 10 MG/ML IV BOLUS
INTRAVENOUS | Status: DC | PRN
  Administered 2021-07-31: 150 mg via INTRAVENOUS
  Administered 2021-07-31: 50 mg via INTRAVENOUS

## 2021-07-31 MED ORDER — CHLORHEXIDINE GLUCONATE 0.12 % MT SOLN: 15.0000 mL | Freq: Once | OROMUCOSAL | Status: AC

## 2021-07-31 MED ORDER — FENTANYL CITRATE (PF) 100 MCG/2ML IJ SOLN
INTRAMUSCULAR | Status: AC
  Filled 2021-07-31: qty 2

## 2021-07-31 MED ORDER — GLYCOPYRROLATE 0.2 MG/ML IJ SOLN
INTRAMUSCULAR | Status: DC | PRN
  Administered 2021-07-31: .2 mg via INTRAVENOUS

## 2021-07-31 MED ORDER — ROCURONIUM BROMIDE 100 MG/10ML IV SOLN
INTRAVENOUS | Status: DC | PRN
  Administered 2021-07-31: 50 mg via INTRAVENOUS
  Administered 2021-07-31 (×2): 20 mg via INTRAVENOUS

## 2021-07-31 MED ORDER — PROPOFOL 10 MG/ML IV BOLUS
INTRAVENOUS | Status: AC
  Filled 2021-07-31: qty 20

## 2021-07-31 MED ORDER — DEXAMETHASONE SODIUM PHOSPHATE 10 MG/ML IJ SOLN
INTRAMUSCULAR | Status: AC
  Filled 2021-07-31: qty 1

## 2021-07-31 SURGICAL SUPPLY — 50 items
ADH SKN CLS APL DERMABOND .7 (GAUZE/BANDAGES/DRESSINGS) ×2
BAG INFUSER PRESSURE 100CC (MISCELLANEOUS) IMPLANT
BLADE SURG SZ11 CARB STEEL (BLADE) ×3 IMPLANT
COVER TIP SHEARS 8 DVNC (MISCELLANEOUS) ×2 IMPLANT
COVER TIP SHEARS 8MM DA VINCI (MISCELLANEOUS) ×1
COVER WAND RF STERILE (DRAPES) ×3 IMPLANT
DERMABOND ADVANCED (GAUZE/BANDAGES/DRESSINGS) ×1
DERMABOND ADVANCED .7 DNX12 (GAUZE/BANDAGES/DRESSINGS) ×2 IMPLANT
DRAPE ARM DVNC X/XI (DISPOSABLE) ×6 IMPLANT
DRAPE COLUMN DVNC XI (DISPOSABLE) ×2 IMPLANT
DRAPE DA VINCI XI ARM (DISPOSABLE) ×3
DRAPE DA VINCI XI COLUMN (DISPOSABLE) ×1
ELECT REM PT RETURN 9FT ADLT (ELECTROSURGICAL) ×3
ELECTRODE REM PT RTRN 9FT ADLT (ELECTROSURGICAL) ×2 IMPLANT
GAUZE 4X4 16PLY ~~LOC~~+RFID DBL (SPONGE) ×3 IMPLANT
GLOVE SURG ENC MOIS LTX SZ6.5 (GLOVE) ×8 IMPLANT
GLOVE SURG UNDER POLY LF SZ6.5 (GLOVE) ×8 IMPLANT
GOWN STRL REUS W/ TWL LRG LVL3 (GOWN DISPOSABLE) ×6 IMPLANT
GOWN STRL REUS W/TWL LRG LVL3 (GOWN DISPOSABLE) ×9
IRRIGATOR SUCT 8 DISP DVNC XI (IRRIGATION / IRRIGATOR) IMPLANT
IRRIGATOR SUCTION 8MM XI DISP (IRRIGATION / IRRIGATOR)
IV CATH ANGIO 12GX3 LT BLUE (NEEDLE) ×2 IMPLANT
IV NS 1000ML (IV SOLUTION)
IV NS 1000ML BAXH (IV SOLUTION) IMPLANT
KIT PINK PAD W/HEAD ARE REST (MISCELLANEOUS) ×3
KIT PINK PAD W/HEAD ARM REST (MISCELLANEOUS) ×2 IMPLANT
LABEL OR SOLS (LABEL) IMPLANT
MANIFOLD NEPTUNE II (INSTRUMENTS) ×1 IMPLANT
MESH 3DMAX 5X7 RT XLRG (Mesh General) ×1 IMPLANT
MESH 3DMAX MID 5X7 RT XLRG (Mesh General) ×1 IMPLANT
NDL INSUFFLATION 14GA 120MM (NEEDLE) ×1 IMPLANT
NEEDLE HYPO 22GX1.5 SAFETY (NEEDLE) ×3 IMPLANT
NEEDLE INSUFFLATION 14GA 120MM (NEEDLE) ×3 IMPLANT
OBTURATOR OPTICAL STANDARD 8MM (TROCAR) ×1
OBTURATOR OPTICAL STND 8 DVNC (TROCAR) ×2
OBTURATOR OPTICALSTD 8 DVNC (TROCAR) ×2 IMPLANT
PACK LAP CHOLECYSTECTOMY (MISCELLANEOUS) ×3 IMPLANT
SEAL CANN UNIV 5-8 DVNC XI (MISCELLANEOUS) ×6 IMPLANT
SEAL XI 5MM-8MM UNIVERSAL (MISCELLANEOUS) ×3
SET TUBE SMOKE EVAC HIGH FLOW (TUBING) ×3 IMPLANT
SOLUTION ELECTROLUBE (MISCELLANEOUS) ×3 IMPLANT
SUT MNCRL 4-0 (SUTURE) ×3
SUT MNCRL 4-0 27XMFL (SUTURE) ×2
SUT VIC AB 2-0 SH 27 (SUTURE) ×3
SUT VIC AB 2-0 SH 27XBRD (SUTURE) ×2 IMPLANT
SUT VLOC 90 S/L VL9 GS22 (SUTURE) ×3 IMPLANT
SUTURE MNCRL 4-0 27XMF (SUTURE) ×2 IMPLANT
TAPE TRANSPORE STRL 2 31045 (GAUZE/BANDAGES/DRESSINGS) IMPLANT
TRAY FOLEY MTR SLVR 16FR STAT (SET/KITS/TRAYS/PACK) ×1 IMPLANT
WATER STERILE IRR 500ML POUR (IV SOLUTION) ×3 IMPLANT

## 2021-07-31 NOTE — Discharge Instructions (Addendum)
  Diet: Resume home heart healthy regular diet.   Activity: No heavy lifting >20 pounds (children, pets, laundry, garbage) or strenuous activity until follow-up, but light activity and walking are encouraged. Do not drive or drink alcohol if taking narcotic pain medications.  Wound care: May shower with soapy water and pat dry (do not rub incisions), but no baths or submerging incision underwater until follow-up. (no swimming)   Medications: Resume all home medications. For mild to moderate pain: acetaminophen (Tylenol) ***or ibuprofen (if no kidney disease). Combining Tylenol with alcohol can substantially increase your risk of causing liver disease. Narcotic pain medications, if prescribed, can be used for severe pain, though may cause nausea, constipation, and drowsiness. Do not combine Tylenol and Norco within a 6 hour period as Norco contains Tylenol. If you do not need the narcotic pain medication, you do not need to fill the prescription.  Call office ([phone]) at any time if any questions, worsening pain, fevers/chills, bleeding, drainage from incision site, or other concerns.   AMBULATORY SURGERY  DISCHARGE INSTRUCTIONS   The drugs that you were given will stay in your system until tomorrow so for the next 24 hours you should not:  Drive an automobile Make any legal decisions Drink any alcoholic beverage   You may resume regular meals tomorrow.  Today it is better to start with liquids and gradually work up to solid foods.  You may eat anything you prefer, but it is better to start with liquids, then soup and crackers, and gradually work up to solid foods.   Please notify your doctor immediately if you have any unusual bleeding, trouble breathing, redness and pain at the surgery site, drainage, fever, or pain not relieved by medication.    Additional Instructions:        Please contact your physician with any problems or Same Day Surgery at [phone], Monday  through Friday 6 am to 4 pm, or [facility] number at [phone].  

## 2021-07-31 NOTE — Interval H&P Note (Signed)
History and Physical Interval Note:  07/31/2021 2:15 PM  Troy Nash  has presented today for surgery, with the diagnosis of K40.90 non recurrent unilateral inguinal hernia w/o obstruction or gangrene.  The various methods of treatment have been discussed with the patient and family. After consideration of risks, benefits and other options for treatment, the patient has consented to  Procedure(s): XI ROBOTIC ASSISTED BILATERAL INGUINAL HERNIA (Bilateral) as a surgical intervention.  The patient's history has been reviewed, patient examined, no change in status, stable for surgery.  I have reviewed the patient's chart and labs.  Questions were answered to the patient's satisfaction.     Carolan Shiver

## 2021-07-31 NOTE — OR Nursing (Signed)
Per Dr. Windell Moment to K. Hubacheck, patient does not need to void prior to discharge.

## 2021-07-31 NOTE — Anesthesia Procedure Notes (Signed)
Procedure Name: Intubation Date/Time: 07/31/2021 2:50 PM Performed by: Irving Burton, CRNA Pre-anesthesia Checklist: Patient identified, Patient being monitored, Timeout performed, Emergency Drugs available and Suction available Patient Re-evaluated:Patient Re-evaluated prior to induction Oxygen Delivery Method: Circle system utilized Preoxygenation: Pre-oxygenation with 100% oxygen Induction Type: IV induction Ventilation: Mask ventilation without difficulty Laryngoscope Size: McGraph and 4 Grade View: Grade I Tube type: Oral Tube size: 7.5 mm Number of attempts: 1 Airway Equipment and Method: Stylet Placement Confirmation: ETT inserted through vocal cords under direct vision, positive ETCO2 and breath sounds checked- equal and bilateral Secured at: 22 cm Tube secured with: Tape Dental Injury: Teeth and Oropharynx as per pre-operative assessment

## 2021-07-31 NOTE — Anesthesia Preprocedure Evaluation (Signed)
Anesthesia Evaluation  Patient identified by MRN, date of birth, ID band Patient awake    Reviewed: Allergy & Precautions, H&P , NPO status , Patient's Chart, lab work & pertinent test results, reviewed documented beta blocker date and time   History of Anesthesia Complications Negative for: history of anesthetic complications  Airway Mallampati: III  TM Distance: >3 FB Neck ROM: full    Dental  (+) Dental Advidsory Given, Teeth Intact, Caps, Missing, Poor Dentition   Pulmonary neg shortness of breath, asthma , neg sleep apnea, neg COPD, neg recent URI, former smoker,    Pulmonary exam normal breath sounds clear to auscultation       Cardiovascular Exercise Tolerance: Good hypertension, (-) angina(-) Past MI and (-) Cardiac Stents Normal cardiovascular exam(-) dysrhythmias (-) Valvular Problems/Murmurs Rhythm:regular Rate:Normal     Neuro/Psych PSYCHIATRIC DISORDERS Anxiety Depression negative neurological ROS     GI/Hepatic negative GI ROS, Neg liver ROS,   Endo/Other  negative endocrine ROS  Renal/GU negative Renal ROS  negative genitourinary   Musculoskeletal   Abdominal   Peds  Hematology negative hematology ROS (+)   Anesthesia Other Findings Past Medical History: No date: Anxiety No date: Asthma     Comment:  as a child No date: Depression No date: Hypertension 2013: Pneumonia   Reproductive/Obstetrics negative OB ROS                             Anesthesia Physical Anesthesia Plan  ASA: 2  Anesthesia Plan: General   Post-op Pain Management:    Induction: Intravenous  PONV Risk Score and Plan: 2 and Ondansetron, Dexamethasone and Treatment may vary due to age or medical condition  Airway Management Planned: Oral ETT  Additional Equipment:   Intra-op Plan:   Post-operative Plan: Extubation in OR  Informed Consent: I have reviewed the patients History and Physical,  chart, labs and discussed the procedure including the risks, benefits and alternatives for the proposed anesthesia with the patient or authorized representative who has indicated his/her understanding and acceptance.     Dental Advisory Given  Plan Discussed with: Anesthesiologist, CRNA and Surgeon  Anesthesia Plan Comments:         Anesthesia Quick Evaluation

## 2021-07-31 NOTE — Anesthesia Postprocedure Evaluation (Signed)
Anesthesia Post Note  Patient: Troy Nash  Procedure(s) Performed: XI ROBOTIC ASSISTED RIGHT INGUINAL HERNIA (Right: Groin)  Patient location during evaluation: PACU Anesthesia Type: General Level of consciousness: awake and alert, oriented and patient cooperative Pain management: pain level controlled Vital Signs Assessment: post-procedure vital signs reviewed and stable Respiratory status: spontaneous breathing, nonlabored ventilation and respiratory function stable Cardiovascular status: blood pressure returned to baseline and stable Postop Assessment: adequate PO intake Anesthetic complications: no   No notable events documented.   Last Vitals:  Vitals:   07/31/21 1712 07/31/21 1721  BP:  131/76  Pulse: 61 60  Resp: (!) 22 18  Temp: (!) 36.1 C 36.8 C  SpO2: 96% 100%    Last Pain:  Vitals:   07/31/21 1721  TempSrc: Temporal  PainSc: 0-No pain                 Reed Breech

## 2021-07-31 NOTE — Transfer of Care (Signed)
Immediate Anesthesia Transfer of Care Note  Patient: Troy Nash  Procedure(s) Performed: XI ROBOTIC ASSISTED RIGHT INGUINAL HERNIA (Right: Groin)  Patient Location: PACU  Anesthesia Type:General  Level of Consciousness: awake  Airway & Oxygen Therapy: Patient Spontanous Breathing and Patient connected to face mask oxygen  Post-op Assessment: Report given to RN and Post -op Vital signs reviewed and stable  Post vital signs: Reviewed and stable  Last Vitals:  Vitals Value Taken Time  BP 131/69 07/31/21 1636  Temp    Pulse 55 07/31/21 1638  Resp 29 07/31/21 1638  SpO2 99 % 07/31/21 1638  Vitals shown include unvalidated device data.  Last Pain:  Vitals:   07/31/21 1236  TempSrc: Temporal  PainSc: 0-No pain         Complications: No notable events documented.

## 2021-07-31 NOTE — Op Note (Signed)
Preoperative diagnosis: Right inguinal hernia.   Postoperative diagnosis: Right inguinal hernia.  Procedure: Robotic assisted Laparoscopic Transabdominal preperitoneal laparoscopic (TAPP) repair of right inguinal hernia.  Anesthesia: GETA  Surgeon: Dr. Hazle Quant  Wound Classification: Clean  Indications:  Patient is a 81 y.o. male developed a symptomatic right inguinal hernia. Repair was indicated.  Findings: 1.  Right inguinal hernia identified 2. Vas deferens and cord structures identified and preserved 3. Bard Extra Large 3D Max MID Anatomical mesh used for repair 4.  Left groin covered by adherent sigmoid colon.  No hernia identified. 5. Adequate hemostasis.     Description of procedure: The patient was taken to the operating room and the correct side of surgery was verified. The patient was placed supine with arms tucked at the sides. After obtaining adequate anesthesia, the patients abdomen was prepped and draped in standard sterile fashion. The patient was placed in the Trendelenburg position. A time-out was completed verifying correct patient, procedure, site, positioning, and implant(s) and/or special equipment prior to beginning this procedure. A Veress needle was placed at the umbilicus and pneumoperitoneum created with insufflation of carbon dioxide to 15 mmHg. After the Veress needle was removed, an 8-mm trocar was placed on epigastric area and the 30 angled laparoscope inserted. Two 8-mm trocars were then placed lateral to the rectus sheath under direct visualization. Both inguinal regions were inspected and the median umbilical ligament, medial umbilical ligament, and lateral umbilical fold were identified.  The robotic arms were docked. The robotic scope was inserted and the pelvic area anatomy targeted.  The peritoneum was incised with scissors along a line 6 cm above the superior edge of the hernia defect, extending from the median umbilical ligament to the anterior  superior iliac spine. The peritoneal flap was mobilized inferiorly using blunt and sharp dissection. The inferior epigastric vessels were exposed and the pubic symphysis was identified. Coopers ligament was dissected to its junction with the iliac vein. The dissection was continued inferiorly to the iliopubic tract, with care taken to avoid injury to the femoral branch of the genitofemoral nerve and the lateral femoral cutaneous nerve. The cord structures were parietalized. The hernia was identified and reduced by gentle traction.  The hernia sac was noted mobilized from the cord structures and reduced into the peritoneal cavity.  An extra large piece of mesh was rolled longitudinally into a compact cylinder and passed through a trocar. The cylinder was placed along the inferior aspect of the working space and unrolled into place to completely cover the direct, indirect, and femoral spaces. The mesh was secured into place superiorly to the anterior abdominal wall and inferiorly and medially to Coopers ligament with absorbable sutures. Care was taken to avoid the inferolateral triangles containing the iliac vessels and genital nerves. The peritoneal flap was closed over the mesh and secured with suture in similar positions of safety. After ensuring adequate hemostasis, the trocars were removed and the pneumoperitoneum allowed to escape. The trocar incisions were closed using monocryl and skin adhesive dressings applied.  The patient tolerated the procedure well and was taken to the postanesthesia care unit in stable condition.   Specimen: None  Complications: None  Estimated Blood Loss: 5 mL

## 2021-08-01 ENCOUNTER — Encounter: Payer: Self-pay | Admitting: General Surgery

## 2023-11-08 ENCOUNTER — Ambulatory Visit
Admission: EM | Admit: 2023-11-08 | Discharge: 2023-11-08 | Disposition: A | Discharge status: Home / Self Care | Attending: Family Medicine | Admitting: Family Medicine

## 2023-11-08 DIAGNOSIS — K029 Dental caries, unspecified: Secondary | ICD-10-CM

## 2023-11-08 DIAGNOSIS — K047 Periapical abscess without sinus: Secondary | ICD-10-CM

## 2023-11-08 MED ORDER — AMOXICILLIN-POT CLAVULANATE 875-125 MG PO TABS: 1.0000 | ORAL_TABLET | Freq: Two times a day (BID) | ORAL | 0 refills | Status: AC

## 2023-11-08 NOTE — ED Provider Notes (Signed)
 [facility]    CSN: [id number] Arrival date & time: 11/08/23  1915      History   Chief Complaint Chief Complaint  Patient presents with   Dental Pain    HPI Troy Nash is a 83 y.o. male.   HPI  Troy Nash presents for upper central dental pain for the past 3 weeks but has gotten worse in the past week.  States that he feels like he is out lift his teeth.  Has some of them have been falling out.  He is not able to use his dental bridge anymore.  Has been taken Tylenol  for pain which helps.  Has not been able to get into a dentist but has dental insurance.  Troy Nash has otherwise been well and has no other concerns.      Past Medical History:  Diagnosis Date   Anxiety    Asthma    as a child   Depression    Hypertension    Pneumonia 2013    There are no active problems to display for this patient.   Past Surgical History:  Procedure Laterality Date   TONSILLECTOMY AND ADENOIDECTOMY  1945   XI ROBOTIC ASSISTED INGUINAL HERNIA REPAIR WITH MESH Right 07/31/2021   Procedure: XI ROBOTIC ASSISTED RIGHT INGUINAL HERNIA;  Surgeon: Eldred Grego, MD;  Location: [facility];  Service: General;  Laterality: Right;       Home Medications    Prior to Admission medications   Medication Sig Start Date End Date Taking? Authorizing Provider  acetaminophen  (TYLENOL ) 500 MG tablet Take 500 mg by mouth every 6 (six) hours as needed.   Yes [provider]  ALPRAZolam (XANAX) 0.25 MG tablet Take 0.25 mg by mouth 3 (three) times daily. 07/07/21  Yes [provider]  amoxicillin -clavulanate (AUGMENTIN ) 875-125 MG tablet Take 1 tablet by mouth every 12 (twelve) hours. 11/08/23  Yes Kenishia Plack, DO  aspirin EC 81 MG tablet Take 81 mg by mouth daily. Swallow whole.   Yes [provider]  Biotin w/ Vitamins C & E (HAIR/SKIN/NAILS PO) Take 1 tablet by mouth daily.   Yes [provider]  CRANBERRY PO Take 1 tablet by mouth. Jonette Nestle   Yes [provider]  FLUoxetine (PROZAC) 20 MG capsule Take 40 mg by mouth daily. 04/29/21  Yes [provider]  GARLIC OIL PO Take 1 tablet by mouth daily.   Yes [provider]  GLUCOSAMINE-CHONDROITIN PO Take 1 tablet by mouth daily.   Yes [provider]  loratadine (CLARITIN) 10 MG tablet Take 10 mg by mouth daily.   Yes [provider]  Multiple Vitamins-Minerals (MULTIVITAMIN WITH MINERALS) tablet Take 1 tablet by mouth daily. With CQ10 Jonette Nestle   Yes [provider]  Nutritional Supplements (IMMUNE ENHANCE) TABS Take 1 tablet by mouth daily. Jonette Nestle   Yes [provider]  olmesartan (BENICAR) 20 MG tablet Take 10 mg by mouth daily. 07/07/21  Yes [provider]  Omega-3 Fatty Acids (OMEGA 3 PO) Take 1 capsule by mouth daily.   Yes [provider]  OVER THE COUNTER MEDICATION Take 1 capsule by mouth 2 (two) times a week. Lutein 12 Andrew lessman   Yes [provider]  OVER THE COUNTER MEDICATION Take 1 tablet by mouth at bedtime. Night time/ melatonin Jonette Nestle   Yes [provider]  tiZANidine (ZANAFLEX) 4 MG tablet Take 4 mg by mouth at bedtime. 07/04/21  Yes [provider]  TURMERIC PO Take 1 tablet by mouth daily.   Yes [provider]  VITAMIN K PO Take 1 tablet by mouth every other day. Jonette Nestle   Yes [provider]  tadalafil (CIALIS) 20 MG tablet Take 20 mg by mouth daily as needed for erectile dysfunction. 05/18/21   [provider]    Family History History reviewed. No pertinent family history.  Social History Social History   Tobacco Use   Smoking status: Former    Current packs/day: 0.00    Types: Cigarettes    Quit date: 1974    Years since quitting: 51.3   Smokeless tobacco: Never  Vaping Use   Vaping status: Never Used  Substance Use Topics   Alcohol use: Not Currently   Drug use: Never      Allergies   Sulfa antibiotics   Review of Systems Review of Systems : negative unless otherwise stated in HPI.      Physical Exam Triage Vital Signs ED Triage Vitals  Encounter Vitals Group     BP      Systolic BP Percentile      Diastolic BP Percentile      Pulse      Resp      Temp      Temp src      SpO2      Weight      Height      Head Circumference      Peak Flow      Pain Score      Pain Loc      Pain Education      Exclude from Growth Chart    No data found.  Updated Vital Signs BP (!) 168/79 (BP Location: Left Arm)   Pulse 64   Temp 98.2 F (36.8 C) (Oral)   Resp 12   Ht 5\' 10"  (1.778 m)   Wt 77.1 kg   SpO2 97%   BMI 24.39 kg/m   Visual Acuity Right Eye Distance:   Left Eye Distance:   Bilateral Distance:    Right Eye Near:   Left Eye Near:    Bilateral Near:     Physical Exam  GEN:     alert, non-toxic appearing male in no distress    HENT:  mucus membranes moist, oropharyngeal without lesions exudates or erythema, nasal discharge, bilateral TM normal, upper central incisors missing but there is a green discolored discharge in the gumline with tenderness to palpation.  No trismus, no secretion pooling, no palpable induration and no visible swelling of the floor the mouth, poor dentition; normal jaw movement without difficulty EYES:   no scleral injection or discharge  RESP:  no increased work of breathing CVS:   regular rate  Skin:   warm and dry, no rash or skin changes of on external jaw    UC Treatments / Results  Labs (all labs ordered are listed, but only abnormal results are displayed) Labs Reviewed - No data to display  EKG   Radiology No results found.  Procedures Procedures (including critical care time)  Medications Ordered in UC Medications - No data to display  Initial Impression / Assessment and Plan / UC Course  I have reviewed the triage vital signs and the nursing notes.  Pertinent labs & imaging  results that were available during my care of the patient were reviewed by me and considered in my medical decision making (see chart for details).  Pt is a 83 y.o. male who presents for 3 weeks of upper dental pain.  Troy Nash is afebrile here. Satting well on room air. Overall pt is well appearing, well hydrated, without respiratory distress.  Dental exam concerning for dental abscess.  Augmentin  prescribed as below. - continue Tylenol  as needed for discomfort - Gargle with salt water several times a day - dental resource handout provided  - Establish care with a dentist.   Discussed MDM, treatment plan and plan for follow-up with patient who agrees with plan.   Final Clinical Impressions(s) / UC Diagnoses   Final diagnoses:  Pain due to dental caries  Dental abscess     Discharge Instructions      At this time there is no abscess to be drained. You were prescribed an antibiotic. Please take this exactly as directed and do not stop taking it until the entire course of medicine is finished, even if you begin to feel better before finishing the course.   Schedule an appointment with your dentist or call local dentists to see if they take your insurance or can do a payment payment plan.  [facility] [facility] are options.   Consider the [facility] dental school  free clinic operates from 6-9 pm on select Wednesdays:  [facility] Dent[facility] Ground Floor, [place] [address] [place], [place] [address] Parking Location: [place]           ED Prescriptions     Medication Sig Dispense Auth. Provider   amoxicillin -clavulanate (AUGMENTIN ) 875-125 MG tablet Take 1 tablet by mouth every 12 (twelve) hours. 20 tablet Barbarita Hutmacher, DO      PDMP not reviewed this encounter.   Achillies Buehl, DO 11/08/23 1942

## 2023-11-08 NOTE — Discharge Instructions (Addendum)
Your sore throat is not related to strep.  I suspect this may be viral.  Gargle with warm salt water several times a day and take ibuprofen for discomfort.  Drinking warm liquids can help with pain as well.  You can purchase some over-the-counter Chloraseptic spray and throat lozenges to help with discomfort.  At this time there is no abscess to be drained. You were prescribed an antibiotic. Please take this exactly as directed and do not stop taking it until the entire course of medicine is finished, even if you begin to feel better before finishing the course.  Schedule an appointment with your dentist or call local dentists to see if they take your insurance or can do a payment payment plan.  [facility] [employer] [facility] are options. Consider the [facility] dental school  free clinic operates from 6-9 pm on select Wednesdays:  [facility] [employer], [place] [address] [place], [place] [address] Parking Location: [place]

## 2023-11-08 NOTE — ED Triage Notes (Signed)
 Pt c/o dental pain x3weeks  Pt states that he has been trying to find a dentist and believes he has a dental infection. Pt asks for a prescription of amoxicillin    Pt has used tylenol  and oral gel before arriving for the pain.
